# Patient Record
Sex: Female | Born: 1985 | Race: White | Hispanic: No | Marital: Single
Health system: Southern US, Community
[De-identification: ages and names within clinical notes are randomized; demographics above are authoritative.]

---

## 2019-10-19 ENCOUNTER — Encounter (HOSPITAL_COMMUNITY): Payer: Self-pay | Admitting: *Deleted

## 2019-10-19 ENCOUNTER — Emergency Department (HOSPITAL_COMMUNITY)
Admission: EM | Admit: 2019-10-19 | Discharge: 2019-10-19 | Disposition: A | Discharge status: Home / Self Care | Payer: No Typology Code available for payment source | Attending: Emergency Medicine | Admitting: Emergency Medicine

## 2019-10-19 ENCOUNTER — Emergency Department (HOSPITAL_COMMUNITY): Payer: No Typology Code available for payment source

## 2019-10-19 ENCOUNTER — Other Ambulatory Visit: Payer: Self-pay

## 2019-10-19 DIAGNOSIS — M25571 Pain in right ankle and joints of right foot: Secondary | ICD-10-CM | POA: Diagnosis present

## 2019-10-19 DIAGNOSIS — Y998 Other external cause status: Secondary | ICD-10-CM | POA: Insufficient documentation

## 2019-10-19 DIAGNOSIS — Y9289 Other specified places as the place of occurrence of the external cause: Secondary | ICD-10-CM | POA: Diagnosis not present

## 2019-10-19 DIAGNOSIS — Y9389 Activity, other specified: Secondary | ICD-10-CM | POA: Diagnosis not present

## 2019-10-19 DIAGNOSIS — S92154A Nondisplaced avulsion fracture (chip fracture) of right talus, initial encounter for closed fracture: Secondary | ICD-10-CM | POA: Insufficient documentation

## 2019-10-19 DIAGNOSIS — W228XXA Striking against or struck by other objects, initial encounter: Secondary | ICD-10-CM | POA: Diagnosis not present

## 2019-10-19 MED ORDER — IBUPROFEN 800 MG PO TABS
800.0000 mg | ORAL_TABLET | Freq: Once | ORAL | Status: AC
  Administered 2019-10-19: 800 mg via ORAL
  Filled 2019-10-19: qty 1

## 2019-10-19 MED ORDER — HYDROCODONE-ACETAMINOPHEN 5-325 MG PO TABS: 1.0000 | ORAL_TABLET | Freq: Four times a day (QID) | ORAL | 0 refills | Status: AC | PRN

## 2019-10-19 NOTE — Discharge Instructions (Signed)
Take ibuprofen 3 times a day with meals.  Do not take other anti-inflammatories at the same time (Advil, Motrin, naproxen, Aleve). You may supplement with Tylenol if you need further pain control. Use Norco as needed for severe breakthrough pain.  Have caution, this make you tired or groggy.  Do not drive or operate heavy machinery while taking his medicine. Use the cam walker to help with pain control. Keep your foot elevated when able. Use ice to help with pain and swelling. Follow-up with orthopedics.  You may follow-up with orthopedic doctor listed below, or whoever your HR department uses for worker's comp. Return to the emergency room if develop severe worsening pain, numbness of your foot, or any new, worsening, or concerning symptoms.

## 2019-10-19 NOTE — ED Provider Notes (Signed)
[facility]-EMERGENCY DEPT Provider Note   CSN: [id number] Arrival date & time: 10/19/19  1109     History Chief Complaint  Patient presents with  . Ankle Pain     Right    Yvonne Rojas is a 34 y.o. female presenting for evaluation of right ankle pain.  Patient states just prior to arrival she was walking at work when she stepped towards the edge of a raised mat, and sharply inverted her right ankle.  She did not fall or hit her head.  Since then, she has had severe right ankle pain over the lateral aspect.  She denies any year hip pain.  She denies numbness or tingling.  She has not taken anything for pain including Tylenol ibuprofen.  She denies previous history of problems with her foot or ankle.  She does not have an orthopedic doctor.  She has no other medical problems, takes no medications daily.  She is not on blood thinners.  HPI     History reviewed. No pertinent past medical history.  There are no problems to display for this patient.   History reviewed. No pertinent surgical history.   OB History   No obstetric history on file.     No family history on file.  Social History   Tobacco Use  . Smoking status: Not on file  Substance Use Topics  . Alcohol use: Not on file  . Drug use: Not on file    Home Medications Prior to Admission medications   Medication Sig Start Date End Date Taking? Authorizing Provider  HYDROcodone-acetaminophen (NORCO/VICODIN) 5-325 MG tablet Take 1 tablet by mouth every 6 (six) hours as needed for severe pain. 10/19/19   Royanne Warshaw, PA-C    Allergies    Patient has no known allergies.  Review of Systems   Review of Systems  Musculoskeletal: Positive for arthralgias and joint swelling.  Neurological: Negative for numbness.    Physical Exam Updated Vital Signs BP (!) 147/89 (BP Location: Left Arm)   Pulse 80   Temp 98.2 F (36.8 C) (Oral)   Resp 16   Ht 5\' 6"  (1.676 m)   Wt 81.6 kg    LMP 10/04/2019   SpO2 98%   BMI 29.05 kg/m   Physical Exam Vitals and nursing note reviewed.  Constitutional:      General: She is not in acute distress.    Appearance: She is well-developed.  HENT:     Head: Normocephalic and atraumatic.  Pulmonary:     Effort: Pulmonary effort is normal.  Abdominal:     General: There is no distension.  Musculoskeletal:        General: Swelling and tenderness present.     Cervical back: Normal range of motion.     Comments: Swelling of the right ankle, mostly over the lateral malleolus.  Tenderness palpation over the lateral malleolus.  Achilles tendon is palpable and intact.  No tenderness palpation elsewhere in the foot.  Pedal pulse 2+ bilaterally.  Good distal sensation and cap refill.  No tenderness of the calf/lower leg  Skin:    General: Skin is warm.     Capillary Refill: Capillary refill takes less than 2 seconds.     Findings: No rash.  Neurological:     Mental Status: She is alert and oriented to person, place, and time.     ED Results / Procedures / Treatments   Labs (all labs ordered are listed, but only abnormal results  are displayed) Labs Reviewed - No data to display  EKG None  Radiology DG Ankle Complete Right  Result Date: 10/19/2019 CLINICAL DATA:  Pain after fall EXAM: RIGHT ANKLE - COMPLETE 3+ VIEW COMPARISON:  None. FINDINGS: Frontal, oblique, and lateral views obtained. There is soft tissue swelling laterally. There is well corticated calcification lateral to the talus which may represent residua of prior trauma. A small calcification is located between the talus and lateral malleolus which likely represents a small acute avulsion. No other evident fracture. No appreciable joint effusion. No appreciable joint space narrowing or erosion. Ankle mortise appears intact. IMPRESSION: Soft tissue swelling laterally with small apparent avulsion between the lateral talus and distal fibula. Calcification more distally lateral to  the talus likely represents residua of prior avulsion. Ankle mortise appears intact.  No appreciable arthropathy. Electronically Signed   By: Bretta Bang III M.D.   On: 10/19/2019 11:54    Procedures Procedures (including critical care time)  Medications Ordered in ED Medications  ibuprofen (ADVIL) tablet 800 mg (800 mg Oral Given 10/19/19 1434)    ED Course  I have reviewed the triage vital signs and the nursing notes.  Pertinent labs & imaging results that were available during my care of the patient were reviewed by me and considered in my medical decision making (see chart for details).    MDM Rules/Calculators/A&P                          Patient presenting for evaluation of right ankle injury.  On exam, patient is neurovascularly intact.  She does have swelling and tenderness, mostly over the lateral aspect of her ankle.  X-rays obtained in triage read interpreted by me.  Shows small avulsion fracture between the talus and the lateral malleolus.  Mortise is intact.  Discussed findings with patient.  Discussed treatment with CAM Walker and pain control.  Discussed rest, ice, elevation.  Encourage follow-up with Ortho.  At this time, patient appears safe for discharge.  Return precautions given.  Patient states she understands and agrees to plan.  Final Clinical Impression(s) / ED Diagnoses Final diagnoses:  Nondisp avuls fracture (chip fracture) of right talus, init    Rx / DC Orders ED Discharge Orders         Ordered    HYDROcodone-acetaminophen (NORCO/VICODIN) 5-325 MG tablet  Every 6 hours PRN     Discontinue  Reprint     10/19/19 1351           Nigeria Lasseter, PA-C 10/19/19 1451    Raeford Razor, MD 10/26/19 1128

## 2019-10-19 NOTE — Progress Notes (Signed)
Orthopedic Tech Progress Note Patient Details:  Yvonne Rojas [birth year] [id number]  Ortho Devices Type of Ortho Device: CAM walker Ortho Device/Splint Location: right Ortho Device/Splint Interventions: Application   Post Interventions Patient Tolerated: Well Instructions Provided: Care of device   [name] 10/19/2019, 2:27 PM

## 2019-10-19 NOTE — ED Triage Notes (Signed)
Pt BIB EMS and presents with a right ankle injury.  Pt was at work and stepped on mats and tripped.  EMS reports pulses on injured ankle. Swelling noted.

## 2021-09-24 IMAGING — CR DG ANKLE COMPLETE 3+V*R*
3 series · 3 of 3 positions shown · non-contrast
Comparison: None.

CLINICAL DATA: Pain after fall

EXAM:
RIGHT ANKLE - COMPLETE 3+ VIEW

[x ankle ap right]
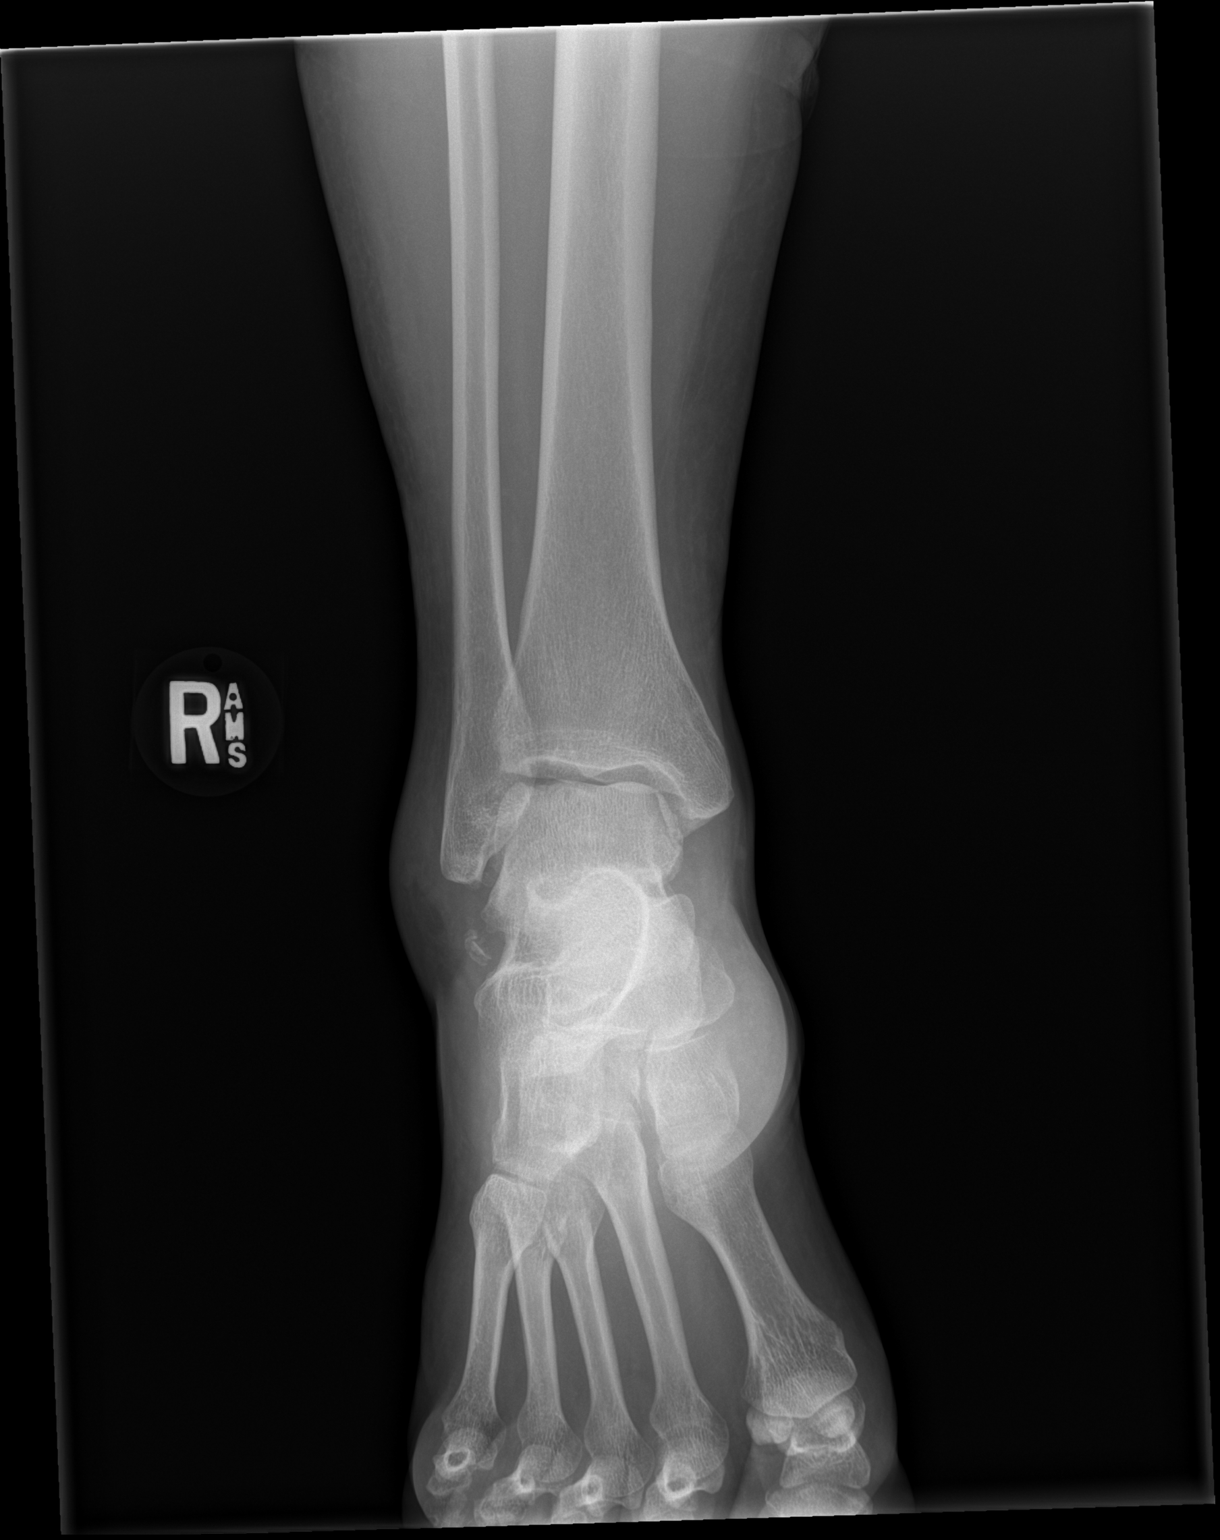

[x ankle obl right]
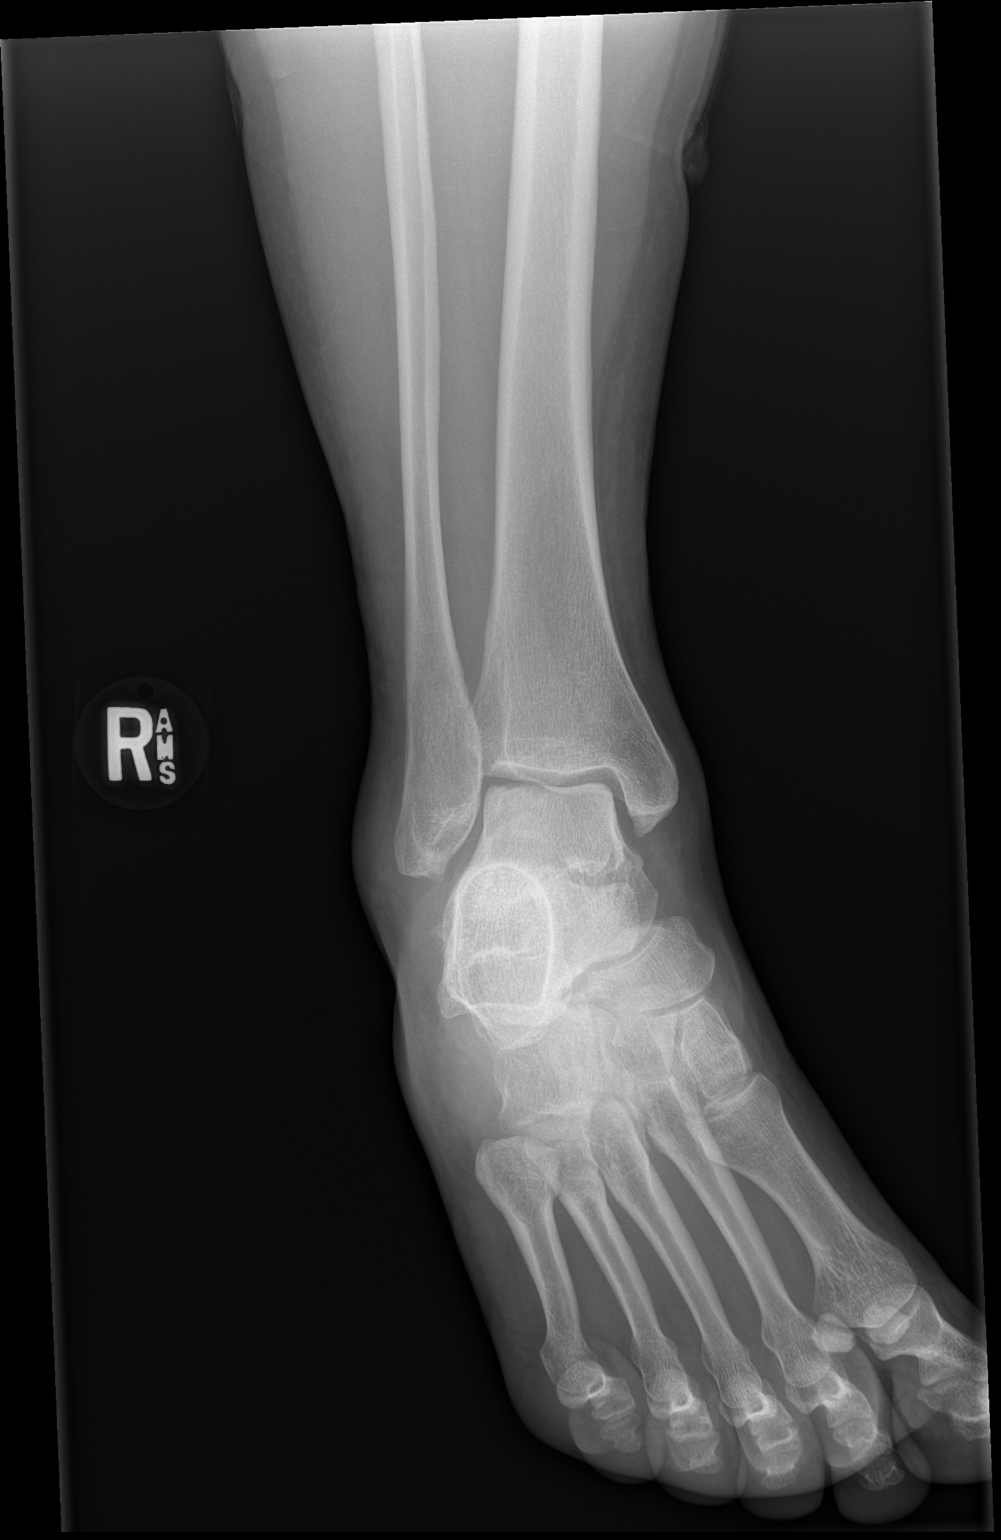

[x ankle lat right]
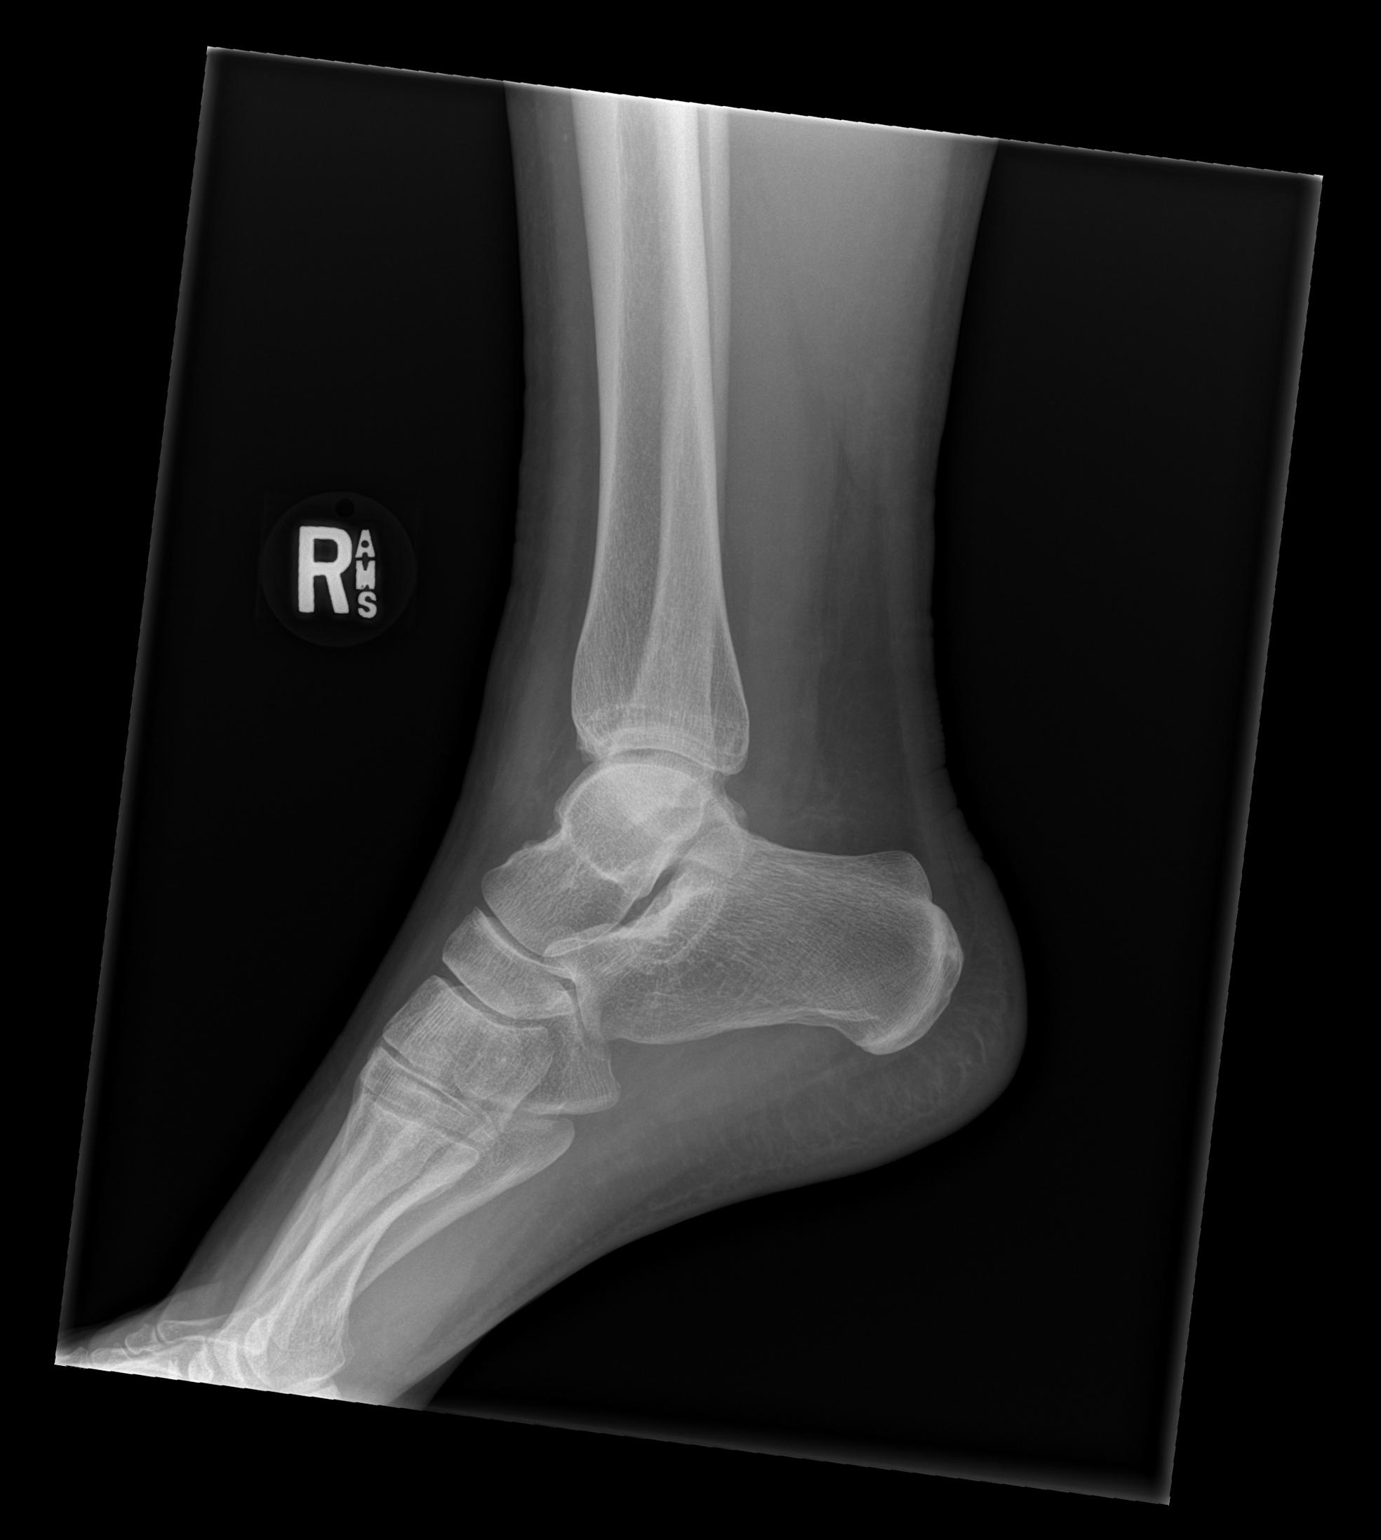

[3 of 3 positions shown; findings below may reference images not displayed]

FINDINGS: Frontal, oblique, and lateral views obtained. There is soft tissue
swelling laterally. There is well corticated calcification lateral
to the talus which may represent residua of prior trauma. A small
calcification is located between the talus and lateral malleolus
which likely represents a small acute avulsion. No other evident
fracture. No appreciable joint effusion. No appreciable joint space
narrowing or erosion. Ankle mortise appears intact.
IMPRESSION: Soft tissue swelling laterally with small apparent avulsion between
the lateral talus and distal fibula. Calcification more distally
lateral to the talus likely represents residua of prior avulsion.

Ankle mortise appears intact.  No appreciable arthropathy.
